# Patient Record
Sex: Male | Born: 1975 | Race: White | Hispanic: No | Marital: Married | Smoking: Former smoker
Health system: Southern US, Community
[De-identification: ages and names within clinical notes are randomized; demographics above are authoritative.]

## PROBLEM LIST (undated history)

## (undated) HISTORY — PX: WISDOM TOOTH EXTRACTION: SHX21

---

## 2016-06-07 ENCOUNTER — Emergency Department: Payer: No Typology Code available for payment source

## 2016-06-07 ENCOUNTER — Encounter: Payer: Self-pay | Admitting: Emergency Medicine

## 2016-06-07 ENCOUNTER — Emergency Department
Admission: EM | Admit: 2016-06-07 | Discharge: 2016-06-07 | Disposition: A | Discharge status: Home / Self Care | Payer: No Typology Code available for payment source | Attending: Emergency Medicine | Admitting: Emergency Medicine

## 2016-06-07 DIAGNOSIS — Y92410 Unspecified street and highway as the place of occurrence of the external cause: Secondary | ICD-10-CM | POA: Insufficient documentation

## 2016-06-07 DIAGNOSIS — Y939 Activity, unspecified: Secondary | ICD-10-CM | POA: Insufficient documentation

## 2016-06-07 DIAGNOSIS — M791 Myalgia: Secondary | ICD-10-CM | POA: Diagnosis not present

## 2016-06-07 DIAGNOSIS — M7918 Myalgia, other site: Secondary | ICD-10-CM

## 2016-06-07 DIAGNOSIS — Z87891 Personal history of nicotine dependence: Secondary | ICD-10-CM | POA: Diagnosis not present

## 2016-06-07 DIAGNOSIS — S161XXA Strain of muscle, fascia and tendon at neck level, initial encounter: Secondary | ICD-10-CM | POA: Insufficient documentation

## 2016-06-07 DIAGNOSIS — S199XXA Unspecified injury of neck, initial encounter: Secondary | ICD-10-CM | POA: Diagnosis present

## 2016-06-07 DIAGNOSIS — Y999 Unspecified external cause status: Secondary | ICD-10-CM | POA: Diagnosis not present

## 2016-06-07 MED ORDER — METHOCARBAMOL 750 MG PO TABS: 750.0000 mg | ORAL_TABLET | Freq: Four times a day (QID) | ORAL | 0 refills | Status: AC

## 2016-06-07 MED ORDER — NAPROXEN 500 MG PO TABS: 500.0000 mg | ORAL_TABLET | Freq: Two times a day (BID) | ORAL | 0 refills | Status: AC
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
